# Patient Record
Sex: Male | Born: 1970 | Race: Black or African American | Hispanic: No | Marital: Married | Smoking: Never smoker
Health system: Southern US, Community
[De-identification: ages and names within clinical notes are randomized; demographics above are authoritative.]

## PROBLEM LIST (undated history)

## (undated) DIAGNOSIS — E119 Type 2 diabetes mellitus without complications: Secondary | ICD-10-CM

## (undated) HISTORY — PX: CIRCUMCISION: SUR203

## (undated) HISTORY — PX: KNEE SURGERY: SHX244

---

## 1998-05-09 ENCOUNTER — Emergency Department (HOSPITAL_COMMUNITY): Admission: EM | Admit: 1998-05-09 | Discharge: 1998-05-09 | Payer: Self-pay | Admitting: Emergency Medicine

## 1999-04-01 ENCOUNTER — Emergency Department (HOSPITAL_COMMUNITY): Admission: EM | Admit: 1999-04-01 | Discharge: 1999-04-01 | Payer: Self-pay | Admitting: Emergency Medicine

## 1999-04-01 ENCOUNTER — Encounter: Payer: Self-pay | Admitting: Emergency Medicine

## 2003-01-22 ENCOUNTER — Encounter: Admission: RE | Admit: 2003-01-22 | Discharge: 2003-04-22 | Payer: Self-pay | Admitting: *Deleted

## 2007-07-19 ENCOUNTER — Encounter (INDEPENDENT_AMBULATORY_CARE_PROVIDER_SITE_OTHER): Payer: Self-pay | Admitting: Urology

## 2007-07-19 ENCOUNTER — Ambulatory Visit (HOSPITAL_COMMUNITY): Admission: RE | Admit: 2007-07-19 | Discharge: 2007-07-19 | Payer: Self-pay | Admitting: Urology

## 2007-11-21 ENCOUNTER — Emergency Department (HOSPITAL_BASED_OUTPATIENT_CLINIC_OR_DEPARTMENT_OTHER): Admission: EM | Admit: 2007-11-21 | Discharge: 2007-11-21 | Payer: Self-pay | Admitting: Emergency Medicine

## 2008-08-15 ENCOUNTER — Ambulatory Visit (HOSPITAL_COMMUNITY): Admission: RE | Admit: 2008-08-15 | Discharge: 2008-08-15 | Payer: Self-pay | Admitting: Family Medicine

## 2010-07-22 NOTE — H&P (Signed)
NAME:  Tyler Simmons, Tyler Simmons             ACCOUNT NO.:  [id number]   MEDICAL RECORD NO.:  [id number]          PATIENT TYPE:  AMB   LOCATION:  DAY                           FACILITY:  [facility]   PHYSICIAN:  Ky Barban, M.D.DATE OF BIRTH:  04-01-1970   DATE OF ADMISSION:  DATE OF DISCHARGE:  LH                              HISTORY & PHYSICAL   This patient is coming as outpatient tomorrow to have surgery done in  [facility].   CHIEF COMPLAINT:  __________   HISTORY:  40 year old gentleman who has having recurrent infection  and papules, so I have advised him to undergo circumcision.  I have not  given him any guarantees about the results, but there is a good chance  he can get rid of these infections.   ALLERGIES:  NEGATIVE.   FAMILY HISTORY:  Negative.   REVIEW OF SYSTEMS:  Unremarkable.   EXAMINATION:  Blood pressure 127/74, temperature 97.6.  CENTRAL NERVOUS SYSTEM:  __________   HEAD/NECK:  Negative.  CHEST:  __________  HEART:  Regular sinus rhythm.  No murmur.  ABDOMEN:  Soft.  Liver, spleen , kidneys are not palpable.  No CVA  tenderness.  EXTERNAL GENITALIA:  Testicles are normal.  RECTAL:  Exam is deferred.   IMPRESSION:  Recurrent __________   PLAN:  Circumcision under anesthesia as outpatient.      Ky Barban, M.D.  Electronically Signed     MIJ/MEDQ  D:  07/18/2007  T:  07/18/2007  Job:  [id number]

## 2010-07-22 NOTE — Op Note (Signed)
NAME:  Tyler Simmons, Tyler Simmons             ACCOUNT NO.:  [id number]   MEDICAL RECORD NO.:  [id number]          PATIENT TYPE:  AMB   LOCATION:  DAY                           FACILITY:  [facility]   PHYSICIAN:  Ky Barban, M.D.DATE OF BIRTH:  [birth year]   DATE OF PROCEDURE:  DATE OF DISCHARGE:                               OPERATIVE REPORT   PREOPERATIVE DIAGNOSIS:  Recurrent prostatitis.   POSTOPERATIVE DIAGNOSIS:  Recurrent prostatitis.   PROCEDURE:  Circumcision.   ANESTHESIA:  General.   PROCEDURE:  The patient is given general endotracheal anesthesia, placed  in supine position.  After usual prep and drape, a dorsal slit is made  and then redundant prepuce is circumferentially excised leaving about 3-  4 mm of mucosa.  Some of the bleeders were ligated, others were simply  fulgurated.  After achieving complete hemostasis, the skin and mucosa  circumferentially closed with interrupted sutures of 4-0 chromic.  At  the end, the wound is wrapped in Vaseline gauze and then 2-inch Kling.  No complications.  The patient left the operating room in satisfactory  condition.      Ky Barban, M.D.  Electronically Signed     MIJ/MEDQ  D:  07/19/2007  T:  07/20/2007  Job:  [id number]

## 2010-12-10 LAB — POCT CARDIAC MARKERS
CKMB, poc: 3.1
Myoglobin, poc: 65.1

## 2010-12-10 LAB — BASIC METABOLIC PANEL
BUN: 13
Calcium: 9.2
Creatinine, Ser: 1
GFR calc Af Amer: >60
GFR calc non Af Amer: >60

## 2013-12-14 ENCOUNTER — Ambulatory Visit (HOSPITAL_COMMUNITY)
Admission: RE | Admit: 2013-12-14 | Discharge: 2013-12-14 | Disposition: A | Discharge status: Home / Self Care | Payer: BC Managed Care – PPO | Source: Ambulatory Visit | Attending: Family Medicine | Admitting: Family Medicine

## 2013-12-14 ENCOUNTER — Other Ambulatory Visit (HOSPITAL_COMMUNITY): Payer: Self-pay | Admitting: Family Medicine

## 2013-12-14 DIAGNOSIS — N508 Other specified disorders of male genital organs: Secondary | ICD-10-CM | POA: Diagnosis present

## 2013-12-14 DIAGNOSIS — I861 Scrotal varices: Secondary | ICD-10-CM | POA: Insufficient documentation

## 2013-12-14 DIAGNOSIS — N492 Inflammatory disorders of scrotum: Secondary | ICD-10-CM

## 2013-12-14 DIAGNOSIS — N5089 Other specified disorders of the male genital organs: Secondary | ICD-10-CM

## 2014-10-20 ENCOUNTER — Encounter (HOSPITAL_COMMUNITY): Payer: Self-pay | Admitting: *Deleted

## 2014-10-20 ENCOUNTER — Inpatient Hospital Stay (HOSPITAL_COMMUNITY)
Admission: EM | Admit: 2014-10-20 | Discharge: 2014-10-21 | DRG: 558 | Disposition: A | Discharge status: Home / Self Care | Payer: BLUE CROSS/BLUE SHIELD | Attending: Internal Medicine | Admitting: Internal Medicine

## 2014-10-20 DIAGNOSIS — N179 Acute kidney failure, unspecified: Secondary | ICD-10-CM

## 2014-10-20 DIAGNOSIS — Z833 Family history of diabetes mellitus: Secondary | ICD-10-CM

## 2014-10-20 DIAGNOSIS — T796XXD Traumatic ischemia of muscle, subsequent encounter: Secondary | ICD-10-CM

## 2014-10-20 DIAGNOSIS — M6282 Rhabdomyolysis: Secondary | ICD-10-CM | POA: Diagnosis present

## 2014-10-20 DIAGNOSIS — E119 Type 2 diabetes mellitus without complications: Secondary | ICD-10-CM | POA: Diagnosis present

## 2014-10-20 DIAGNOSIS — E860 Dehydration: Secondary | ICD-10-CM

## 2014-10-20 HISTORY — DX: Type 2 diabetes mellitus without complications: E11.9

## 2014-10-20 LAB — GLUCOSE, CAPILLARY: GLUCOSE-CAPILLARY: 141 mg/dL — AB (ref 65–99)

## 2014-10-20 LAB — CBG MONITORING, ED: Glucose-Capillary: 141 mg/dL — ABNORMAL HIGH (ref 65–99)

## 2014-10-20 LAB — CBC WITH DIFFERENTIAL/PLATELET
BASOS PCT: 0 % (ref 0–1)
Basophils Absolute: 0 10*3/uL (ref 0.0–0.1)
EOS ABS: 0 10*3/uL (ref 0.0–0.7)
Eosinophils Relative: 0 % (ref 0–5)
HCT: 48.5 % (ref 39.0–52.0)
Hemoglobin: 16.3 g/dL (ref 13.0–17.0)
Lymphocytes Relative: 29 % (ref 12–46)
Lymphs Abs: 3 10*3/uL (ref 0.7–4.0)
MCH: 28.4 pg (ref 26.0–34.0)
MCHC: 33.6 g/dL (ref 30.0–36.0)
MCV: 84.5 fL (ref 78.0–100.0)
MONO ABS: 1 10*3/uL (ref 0.1–1.0)
Monocytes Relative: 9 % (ref 3–12)
Neutro Abs: 6.5 10*3/uL (ref 1.7–7.7)
Neutrophils Relative %: 62 % (ref 43–77)
Platelets: 219 10*3/uL (ref 150–400)
RBC: 5.74 MIL/uL (ref 4.22–5.81)
RDW: 14.3 % (ref 11.5–15.5)
WBC: 10.5 10*3/uL (ref 4.0–10.5)

## 2014-10-20 LAB — BASIC METABOLIC PANEL
ANION GAP: 16 — AB (ref 5–15)
BUN: 22 mg/dL — AB (ref 6–20)
CALCIUM: 11.3 mg/dL — AB (ref 8.9–10.3)
CO2: 23 mmol/L (ref 22–32)
Chloride: 101 mmol/L (ref 101–111)
Creatinine, Ser: 2.77 mg/dL — ABNORMAL HIGH (ref 0.61–1.24)
GFR calc Af Amer: 30 mL/min — ABNORMAL LOW (ref >60)
GFR, EST NON AFRICAN AMERICAN: 26 mL/min — AB (ref >60)
GLUCOSE: 140 mg/dL — AB (ref 65–99)
POTASSIUM: 4.1 mmol/L (ref 3.5–5.1)
Sodium: 140 mmol/L (ref 135–145)

## 2014-10-20 LAB — TROPONIN I

## 2014-10-20 LAB — CK: Total CK: 1074 U/L — ABNORMAL HIGH (ref 49–397)

## 2014-10-20 MED ORDER — ONDANSETRON HCL 4 MG PO TABS: 4.0000 mg | ORAL_TABLET | Freq: Four times a day (QID) | ORAL | Status: DC | PRN

## 2014-10-20 MED ORDER — DIAZEPAM 5 MG/ML IJ SOLN
2.5000 mg | Freq: Once | INTRAMUSCULAR | Status: AC
  Administered 2014-10-20: 2.5 mg via INTRAVENOUS
  Filled 2014-10-20: qty 2

## 2014-10-20 MED ORDER — MORPHINE SULFATE 2 MG/ML IJ SOLN
2.0000 mg | INTRAMUSCULAR | Status: DC | PRN
Start: 2014-10-20 — End: 2014-10-21

## 2014-10-20 MED ORDER — SODIUM CHLORIDE 0.9 % IJ SOLN
3.0000 mL | Freq: Two times a day (BID) | INTRAMUSCULAR | Status: DC
  Administered 2014-10-20: 3 mL via INTRAVENOUS

## 2014-10-20 MED ORDER — INSULIN ASPART 100 UNIT/ML ~~LOC~~ SOLN: 0.0000 [IU] | Freq: Three times a day (TID) | SUBCUTANEOUS | Status: DC

## 2014-10-20 MED ORDER — SODIUM CHLORIDE 0.9 % IV BOLUS (SEPSIS)
30.0000 mL/kg | Freq: Once | INTRAVENOUS | Status: AC
  Administered 2014-10-20: 3129 mL via INTRAVENOUS

## 2014-10-20 MED ORDER — ACETAMINOPHEN 325 MG PO TABS: 650.0000 mg | ORAL_TABLET | Freq: Four times a day (QID) | ORAL | Status: DC | PRN

## 2014-10-20 MED ORDER — INSULIN ASPART 100 UNIT/ML ~~LOC~~ SOLN: 0.0000 [IU] | Freq: Every day | SUBCUTANEOUS | Status: DC

## 2014-10-20 MED ORDER — SODIUM CHLORIDE 0.9 % IV SOLN
INTRAVENOUS | Status: DC
  Administered 2014-10-20: 21:00:00 via INTRAVENOUS

## 2014-10-20 MED ORDER — FENTANYL CITRATE (PF) 100 MCG/2ML IJ SOLN
50.0000 ug | Freq: Once | INTRAMUSCULAR | Status: AC
  Administered 2014-10-20: 50 ug via INTRAVENOUS
  Filled 2014-10-20: qty 2

## 2014-10-20 MED ORDER — ONDANSETRON HCL 4 MG/2ML IJ SOLN: 4.0000 mg | Freq: Four times a day (QID) | INTRAMUSCULAR | Status: DC | PRN

## 2014-10-20 MED ORDER — ASPIRIN 325 MG PO TABS
325.0000 mg | ORAL_TABLET | Freq: Once | ORAL | Status: AC
  Administered 2014-10-20: 325 mg via ORAL
  Filled 2014-10-20: qty 1

## 2014-10-20 MED ORDER — HEPARIN SODIUM (PORCINE) 5000 UNIT/ML IJ SOLN: 5000.0000 [IU] | Freq: Three times a day (TID) | INTRAMUSCULAR | Status: DC

## 2014-10-20 MED ORDER — ACETAMINOPHEN 650 MG RE SUPP: 650.0000 mg | Freq: Four times a day (QID) | RECTAL | Status: DC | PRN

## 2014-10-20 NOTE — ED Notes (Signed)
Report attempted. RN to call back.

## 2014-10-20 NOTE — ED Notes (Signed)
While assessing the patient, Tyler Simmons had a LOC for approximately 20 seconds. When patient awoke patient was unaware of his surroundings and had no idea how he got here. After orienting patient he returned to pre LOC state.

## 2014-10-20 NOTE — ED Provider Notes (Signed)
CSN: [id number]     Arrival date & time 10/20/14  1728 History   First MD Initiated Contact with Patient 10/20/14 1803     Chief Complaint  Patient presents with  . Spasms     (Consider location/radiation/quality/duration/timing/severity/associated sxs/prior Treatment) Patient is a 44 y.o. male presenting with general illness.  Illness Location:  Whole body Quality:  Cramping, sleepiness Severity:  Mild Onset quality:  Gradual Duration:  2 hours Timing:  Constant Chronicity:  New Context:  After prolonged heat exposure one day prior, today without adequate fluid intake Relieved by:  Improved some with gatorade Worsened by:  Movement Ineffective treatments:  None  Associated symptoms: fatigue, myalgias and nausea   Associated symptoms: no abdominal pain, no chest pain, no cough, no fever, no loss of consciousness and no vomiting     Past Medical History  Diagnosis Date  . Diabetes mellitus without complication    History reviewed. No pertinent past surgical history. History reviewed. No pertinent family history. Social History  Substance Use Topics  . Smoking status: Never Smoker   . Smokeless tobacco: None  . Alcohol Use: Yes     Comment: Occ    Review of Systems  Constitutional: Positive for fatigue. Negative for fever.  Respiratory: Negative for cough.   Cardiovascular: Negative for chest pain.  Gastrointestinal: Positive for nausea. Negative for vomiting and abdominal pain.  Endocrine: Negative for polydipsia and polyuria.  Musculoskeletal: Positive for myalgias.  Neurological: Positive for syncope (here per nurse report). Negative for loss of consciousness.  Psychiatric/Behavioral: Positive for confusion (intermittent).  All other systems reviewed and are negative.     Allergies  Review of patient's allergies indicates no known allergies.  Home Medications   Prior to Admission medications   Medication Sig Start Date End Date Taking? Authorizing  Provider  JANUVIA 100 MG tablet Take 100 mg by mouth daily. 08/27/14  Yes Historical Provider, MD  pioglitazone-metformin (ACTOPLUS MET) 15-850 MG per tablet Take 1 tablet by mouth 2 (two) times daily. 09/15/14  Yes Historical Provider, MD  VICTOZA 18 MG/3ML SOPN Inject 1.8 mLs into the skin daily. 09/15/14  Yes Historical Provider, MD  Vitamin D, Ergocalciferol, (DRISDOL) 50000 UNITS CAPS capsule Take 50,000 Units by mouth once a week. Takes on Sunday 09/15/14   Historical Provider, MD   BP 128/60 mmHg  Pulse 60  Temp(Src) 97.6 F (36.4 C) (Oral)  Resp 16  Ht _0  (1.803 m)  Wt 225 lb 1.6 oz (102.105 kg)  BMI 31.41 kg/m2  SpO2 100% Physical Exam  Constitutional: He is oriented to person, place, and time. He appears well-developed and well-nourished.  HENT:  Head: Normocephalic and atraumatic.  Eyes: Conjunctivae and EOM are normal.  Neck: Normal range of motion. Neck supple.  Cardiovascular: Regular rhythm.  Tachycardia present.   Pulmonary/Chest: Effort normal. No respiratory distress.  Abdominal: Soft. There is no tenderness.  Musculoskeletal: Normal range of motion. He exhibits no edema or tenderness.  Neurological: He is alert and oriented to person, place, and time.  Skin: Skin is warm and dry.  Tenting, delayed cap refill  Nursing note and vitals reviewed.   ED Course  Procedures (including critical care time) Labs Review Labs Reviewed  BASIC METABOLIC PANEL - Abnormal; Notable for the following:    Glucose, Bld 140 (*)    BUN 22 (*)    Creatinine, Ser 2.77 (*)    Calcium 11.3 (*)    GFR calc non Af Amer 26 (*)  GFR calc Af Amer 30 (*)    Anion gap 16 (*)    All other components within normal limits  CK - Abnormal; Notable for the following:    Total CK 1074 (*)    All other components within normal limits  COMPREHENSIVE METABOLIC PANEL - Abnormal; Notable for the following:    Calcium 8.4 (*)    Total Protein 6.3 (*)    All other components within normal limits   CBC - Abnormal; Notable for the following:    Hemoglobin 12.8 (*)    All other components within normal limits  CK - Abnormal; Notable for the following:    Total CK 1449 (*)    All other components within normal limits  GLUCOSE, CAPILLARY - Abnormal; Notable for the following:    Glucose-Capillary 141 (*)    All other components within normal limits  GLUCOSE, CAPILLARY - Abnormal; Notable for the following:    Glucose-Capillary 106 (*)    All other components within normal limits  CBG MONITORING, ED - Abnormal; Notable for the following:    Glucose-Capillary 141 (*)    All other components within normal limits  CBC WITH DIFFERENTIAL/PLATELET  TROPONIN I    Imaging Review No results found. I, Mckenzye Cutright, Corene Cornea, personally reviewed and evaluated these images and lab results as part of my medical decision-making.   EKG Interpretation None      MDM   Final diagnoses:  Non-traumatic rhabdomyolysis  Acute renal failure, unspecified acute renal failure type  Dehydration   44 year old male with a history of diabetes presents to the emergency department today for initial concern of dehydration versus he syncope. However found to have rhabdomyolysis while here. There is likely secondary to heat exposure working on the heat. 3 L of fluids given and patient started having urine output. However with significant acute renal failure and a CK low thousand I admitted to hospitalist for further management. Did not feel like he was going to need dialysis so was admitted here.  Spoke with cardiology about st elevations seen on ecg. Without chest pain, sob or other cardiac equivalents, diffuse elevations on ECG, no reciprocal changes and negative troponin, Dr. Rosanna Randy thought it unlikely to be ACS requiring intervention and more likely to be repolarization and felt that he could be admitted here for management of rhabdomyolysis.    The patient appears reasonably stabilized for admission considering  the current resources, flow, and capabilities available in the ED at this time, and I doubt any other [facility] requiring further screening and/or treatment in the ED prior to admission.     Merrily Pew, MD 10/21/14 1037

## 2014-10-20 NOTE — ED Notes (Signed)
Pt has been outside all day mowing from 10a-4p.. Pt states cramping all over body began 1hour ago. Patient is in severe pain and is diaphoretic.

## 2014-10-20 NOTE — ED Notes (Signed)
Patient states pain radiates from his tailbone down to his foot. Hurts with movement.

## 2014-10-20 NOTE — H&P (Addendum)
[facility] History and Physical  Tyler Simmons CXK:[id number] DOB: 1970-08-12 DOA: 10/20/2014  Referring physician: Emergency Department PCP: Purvis Kilts, MD  Specialists:   Chief Complaint: ARF  HPI: Tyler Simmons is a 44 y.o. male  With a hx of DM presents with diffuse cramping after working outdoors in the heat for multiple hours today. In the ED, pt was noted to have Cr of 2.77 with BUN of 22 and CK of 1074. Pt was started on IVF in the ED. During evaluation, pt noted to have transient episode of LOC, given one dose of valium.  Given ARF with concerns of rhabdo, hospitalist was consulted for consideration for admission  Review of Systems:   Review of Systems  Constitutional: Positive for malaise/fatigue. Negative for chills.  HENT: Negative for tinnitus.   Eyes: Negative for pain and discharge.  Respiratory: Negative for hemoptysis and shortness of breath.   Cardiovascular: Negative for orthopnea and PND.  Gastrointestinal: Negative for abdominal pain and melena.  Genitourinary: Negative for frequency and hematuria.  Musculoskeletal: Positive for myalgias, back pain and joint pain.  Skin: Negative for itching.  Neurological: Positive for weakness. Negative for tingling, tremors and headaches.  Psychiatric/Behavioral: Negative for hallucinations and memory loss.     Past Medical History  Diagnosis Date  . Diabetes mellitus without complication    History reviewed. No pertinent past surgical history. Social History:  reports that he has never smoked. He does not have any smokeless tobacco history on file. He reports that he drinks alcohol. His drug history is not on file.  where does patient live--home, ALF, SNF? and with whom if at home?  Can patient participate in ADLs?  No Known Allergies  No family history on file. Diabetes "everyone in the family" (be sure to complete)  Prior to Admission medications   Medication Sig Start Date End Date  Taking? Authorizing Provider  JANUVIA 100 MG tablet Take 100 mg by mouth daily. 08/27/14  Yes Historical Provider, MD  pioglitazone-metformin (ACTOPLUS MET) 15-850 MG per tablet Take 1 tablet by mouth 2 (two) times daily. 09/15/14  Yes Historical Provider, MD  VICTOZA 18 MG/3ML SOPN Inject 1.8 mLs into the skin daily. 09/15/14  Yes Historical Provider, MD  Vitamin D, Ergocalciferol, (DRISDOL) 50000 UNITS CAPS capsule Take 50,000 Units by mouth once a week. Takes on Sunday 09/15/14   Historical Provider, MD   Physical Exam: Filed Vitals:   10/20/14 1735 10/20/14 1830 10/20/14 2010  BP:  129/92 134/82  Pulse: 80 93 85  Temp: 97.8 F (36.6 C)    TempSrc: Oral    Resp: 36 17 18  Height: 5' 10" (1.778 m)    Weight: 104.327 kg (230 lb)    SpO2: 98% 90% 100%     General:  Awake, in nad  Eyes: PERRL B  ENT: membranes dry, dentition fair  Neck: trachea midline, neck supple  Cardiovascular: regular, s1, s2  Respiratory: normal resp effort, no wheezing  Abdomen: soft,nondistended  Skin: decreased skin turgor, no abnormal skin lesions seen  Musculoskeletal: perfused, no clubbing  Psychiatric: mood/affect normal //no auditory/visual hallucinations  Neurologic: cn2-12 grossly intact, strength/sensation intact  Labs on Admission:  Basic Metabolic Panel:  Recent Labs Lab 10/20/14 1812  NA 140  K 4.1  CL 101  CO2 23  GLUCOSE 140*  BUN 22*  CREATININE 2.77*  CALCIUM 11.3*   Liver Function Tests: No results for input(s): AST, ALT, ALKPHOS, BILITOT, PROT, ALBUMIN in the last 168 hours.  No results for input(s): LIPASE, AMYLASE in the last 168 hours. No results for input(s): AMMONIA in the last 168 hours. CBC:  Recent Labs Lab 10/20/14 1812  WBC 10.5  NEUTROABS 6.5  HGB 16.3  HCT 48.5  MCV 84.5  PLT 219   Cardiac Enzymes:  Recent Labs Lab 10/20/14 1812  CKTOTAL 1074*  TROPONINI <0.03    BNP (last 3 results) No results for input(s): BNP in the last 8760  hours.  ProBNP (last 3 results) No results for input(s): PROBNP in the last 8760 hours.  CBG:  Recent Labs Lab 10/20/14 1810  GLUCAP 141*    Radiological Exams on Admission: No results found.  EKG: Independently reviewed. Diffuse ST elevation  Assessment/Plan Principal Problem:   Rhabdomyolysis Active Problems:   ARF (acute renal failure)   Dehydration   1. Rhabdomyolysis 1. Likely secondary to working outdoors in heat with little hydration 2. Cont aggressive IVF as tolerated 3. Avoid nephrotoxic agents 4. Admit to med-tele, inpt 2. ARF 1. Secondary to above 2. Cont aggressive IVF 3. Follow renal function closely 3. Dehydration 1. Cont with IVF hydration 4. DM 1. Cont SSI coverage 5. DVT prophylaxis 1. Heparin subq 6. Transient LOC 1. Suspect secondary to marked dehydration 2. Monitor closely 7. ST changes on EKG 1. Pt denies chest pain, sob, nausea. Initial trop neg 2. EDP had discussed EKG with Cardiology - see EDP note 3. Essentially, EKG changes likely not secondary to STEMI 4. Will repeat EKG in AM  Code Status: Full Family Communication: Pt in room, family at bedside Disposition Plan: admit med-tele   CHIU, STEPHEN K [facility] Pager [phone]  If 7PM-7AM, please contact night-coverage [email or web address] Password [email or web address] 10/20/2014, 8:13 PM  

## 2014-10-21 DIAGNOSIS — T796XXD Traumatic ischemia of muscle, subsequent encounter: Secondary | ICD-10-CM

## 2014-10-21 DIAGNOSIS — E860 Dehydration: Secondary | ICD-10-CM

## 2014-10-21 DIAGNOSIS — N179 Acute kidney failure, unspecified: Secondary | ICD-10-CM

## 2014-10-21 LAB — COMPREHENSIVE METABOLIC PANEL
ALT: 26 U/L (ref 17–63)
AST: 33 U/L (ref 15–41)
Albumin: 3.5 g/dL (ref 3.5–5.0)
Alkaline Phosphatase: 59 U/L (ref 38–126)
Anion gap: 6 (ref 5–15)
BUN: 14 mg/dL (ref 6–20)
CALCIUM: 8.4 mg/dL — AB (ref 8.9–10.3)
CO2: 24 mmol/L (ref 22–32)
CREATININE: 1.01 mg/dL (ref 0.61–1.24)
Chloride: 111 mmol/L (ref 101–111)
GFR calc Af Amer: >60 mL/min (ref >60)
GLUCOSE: 99 mg/dL (ref 65–99)
POTASSIUM: 3.8 mmol/L (ref 3.5–5.1)
SODIUM: 141 mmol/L (ref 135–145)
Total Bilirubin: 0.7 mg/dL (ref 0.3–1.2)
Total Protein: 6.3 g/dL — ABNORMAL LOW (ref 6.5–8.1)

## 2014-10-21 LAB — CBC
HEMATOCRIT: 39.3 % (ref 39.0–52.0)
HEMOGLOBIN: 12.8 g/dL — AB (ref 13.0–17.0)
MCH: 27.7 pg (ref 26.0–34.0)
MCHC: 32.6 g/dL (ref 30.0–36.0)
MCV: 85.1 fL (ref 78.0–100.0)
PLATELETS: 183 10*3/uL (ref 150–400)
RBC: 4.62 MIL/uL (ref 4.22–5.81)
RDW: 14.5 % (ref 11.5–15.5)
WBC: 9.6 10*3/uL (ref 4.0–10.5)

## 2014-10-21 LAB — CK: Total CK: 1449 U/L — ABNORMAL HIGH (ref 49–397)

## 2014-10-21 LAB — GLUCOSE, CAPILLARY: GLUCOSE-CAPILLARY: 106 mg/dL — AB (ref 65–99)

## 2014-10-21 NOTE — Discharge Summary (Signed)
Physician Discharge Summary  CHIMAOBI CASEBOLT DSK:[id number] DOB: 20-Mar-1970 DOA: 10/20/2014  PCP: Purvis Kilts, MD  Admit date: 10/20/2014 Discharge date: 10/21/2014  Time spent: 45 minutes  Recommendations for Outpatient Follow-up:  -Will be discharged home today. -Advised to follow-up with primary care provider in 2 weeks. -Counseled on importance of taking copious amounts of oral fluids.   Discharge Diagnoses:  Principal Problem:   Rhabdomyolysis Active Problems:   ARF (acute renal failure)   Dehydration   Discharge Condition: Stable and improved  Filed Weights   10/20/14 1735 10/20/14 2300  Weight: 104.327 kg (230 lb) 102.105 kg (225 lb 1.6 oz)    History of present illness:  Tyler Simmons is a 44 y.o. male  With a hx of DM presents with diffuse cramping after working outdoors in the heat for multiple hours today. In the ED, pt was noted to have Cr of 2.77 with BUN of 22 and CK of 1074. Pt was started on IVF in the ED. During evaluation, pt noted to have transient episode of LOC, given one dose of valium.  Given ARF with concerns of rhabdo, hospitalist was consulted for consideration for admission  Hospital Course:   Rhabdomyolysis -Secondary to working outdoors in the heat with little hydration. -Muscle cramps have resolved, patient desires to discharge home today. CPK still elevated at around 1000. -Counseled on importance of taking copious amounts of oral fluids.  Acute renal failure -Secondary to rhabdomyolysis. -Resolved with IV hydration.  Transient loss of consciousness. -Secondary to dehydration, further workup.  Diffuse ST changes on EKG -Discussion was had between EDP and cardiologist at [facility], decision was made that this appeared to be more repolarization changes and with lack of chest pain, no further cardiac workup was recommended.  Procedures:  None   Consultations:  None  Discharge Instructions  Discharge  Instructions    Diet Carb Modified    Complete by:  As directed      Increase activity slowly    Complete by:  As directed             Medication List    TAKE these medications        JANUVIA 100 MG tablet  Generic drug:  sitaGLIPtin  Take 100 mg by mouth daily.     pioglitazone-metformin 15-850 MG per tablet  Commonly known as:  ACTOPLUS MET  Take 1 tablet by mouth 2 (two) times daily.     VICTOZA 18 MG/3ML Sopn  Generic drug:  Liraglutide  Inject 1.8 mLs into the skin daily.     Vitamin D (Ergocalciferol) 50000 UNITS Caps capsule  Commonly known as:  DRISDOL  Take 50,000 Units by mouth once a week. Takes on Sunday       No Known Allergies     Follow-up Information    Follow up with Purvis Kilts, MD. Schedule an appointment as soon as possible for a visit in 2 weeks.   Specialty:  Family Medicine   Contact information:   [address] [place] [address] [phone]        The results of significant diagnostics from this hospitalization (including imaging, microbiology, ancillary and laboratory) are listed below for reference.    Significant Diagnostic Studies: No results found.  Microbiology: No results found for this or any previous visit (from the past 240 hour(s)).   Labs: Basic Metabolic Panel:  Recent Labs Lab 10/20/14 1812 10/21/14 0602  NA 140 141  K 4.1  3.8  CL 101 111  CO2 23 24  GLUCOSE 140* 99  BUN 22* 14  CREATININE 2.77* 1.01  CALCIUM 11.3* 8.4*   Liver Function Tests:  Recent Labs Lab 10/21/14 0602  AST 33  ALT 26  ALKPHOS 59  BILITOT 0.7  PROT 6.3*  ALBUMIN 3.5   No results for input(s): LIPASE, AMYLASE in the last 168 hours. No results for input(s): AMMONIA in the last 168 hours. CBC:  Recent Labs Lab 10/20/14 1812 10/21/14 0602  WBC 10.5 9.6  NEUTROABS 6.5  --   HGB 16.3 12.8*  HCT 48.5 39.3  MCV 84.5 85.1  PLT 219 183   Cardiac Enzymes:  Recent Labs Lab 10/20/14 1812 10/21/14 0602   CKTOTAL 6283* 1449*  TROPONINI <0.03  --    BNP: BNP (last 3 results) No results for input(s): BNP in the last 8760 hours.  ProBNP (last 3 results) No results for input(s): PROBNP in the last 8760 hours.  CBG:  Recent Labs Lab 10/20/14 1810 10/20/14 2315 10/21/14 0727  GLUCAP 141* 141* 106*       Signed:  HERNANDEZ ACOSTA,ESTELA  [facility] Pager: [phone] 10/21/2014, 11:23 AM

## 2014-10-21 NOTE — Progress Notes (Signed)
Pt and family educated on condition and treatment.  Pt advised to stay out of the heat for the next 10 days and to rehydrate when he gets home.  Pt and family members in the room all verbalize understanding.  IV d/c and site is WNL.  Pt in stable condition at discharge and discharged to the care of his family.

## 2014-10-21 NOTE — Progress Notes (Signed)
Utilization review Completed Mcarthur Ivins RN BSN   

## 2018-01-05 ENCOUNTER — Emergency Department (HOSPITAL_COMMUNITY): Payer: BLUE CROSS/BLUE SHIELD

## 2018-01-05 ENCOUNTER — Encounter (HOSPITAL_COMMUNITY): Payer: Self-pay

## 2018-01-05 ENCOUNTER — Other Ambulatory Visit: Payer: Self-pay

## 2018-01-05 ENCOUNTER — Emergency Department (HOSPITAL_COMMUNITY)
Admission: EM | Admit: 2018-01-05 | Discharge: 2018-01-06 | Disposition: A | Discharge status: Home / Self Care | Payer: BLUE CROSS/BLUE SHIELD | Attending: Emergency Medicine | Admitting: Emergency Medicine

## 2018-01-05 DIAGNOSIS — Y9389 Activity, other specified: Secondary | ICD-10-CM | POA: Diagnosis not present

## 2018-01-05 DIAGNOSIS — M542 Cervicalgia: Secondary | ICD-10-CM | POA: Diagnosis present

## 2018-01-05 DIAGNOSIS — M545 Low back pain: Secondary | ICD-10-CM | POA: Diagnosis not present

## 2018-01-05 DIAGNOSIS — Y999 Unspecified external cause status: Secondary | ICD-10-CM | POA: Insufficient documentation

## 2018-01-05 DIAGNOSIS — E119 Type 2 diabetes mellitus without complications: Secondary | ICD-10-CM | POA: Diagnosis not present

## 2018-01-05 DIAGNOSIS — R0789 Other chest pain: Secondary | ICD-10-CM | POA: Diagnosis not present

## 2018-01-05 DIAGNOSIS — Y92410 Unspecified street and highway as the place of occurrence of the external cause: Secondary | ICD-10-CM | POA: Insufficient documentation

## 2018-01-05 DIAGNOSIS — Z7984 Long term (current) use of oral hypoglycemic drugs: Secondary | ICD-10-CM | POA: Insufficient documentation

## 2018-01-05 MED ORDER — HYDROMORPHONE HCL 1 MG/ML IJ SOLN
1.0000 mg | Freq: Once | INTRAMUSCULAR | Status: AC
  Administered 2018-01-05: 1 mg via INTRAMUSCULAR
  Filled 2018-01-05: qty 1
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
## 2018-01-05 NOTE — ED Notes (Signed)
Patient transported to CT 

## 2018-01-05 NOTE — ED Notes (Signed)
Patient transported to X-ray 
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
## 2018-01-06 LAB — CBG MONITORING, ED: Glucose-Capillary: 99 mg/dL (ref 70–99)

## 2018-01-06 MED ORDER — CYCLOBENZAPRINE HCL 10 MG PO TABS: 10.0000 mg | ORAL_TABLET | Freq: Two times a day (BID) | ORAL | 0 refills | Status: AC | PRN

## 2018-01-06 MED ORDER — IBUPROFEN 800 MG PO TABS: 800.0000 mg | ORAL_TABLET | Freq: Three times a day (TID) | ORAL | 0 refills | Status: DC

## 2019-12-30 ENCOUNTER — Other Ambulatory Visit: Payer: Self-pay

## 2019-12-30 ENCOUNTER — Ambulatory Visit
Admission: EM | Admit: 2019-12-30 | Discharge: 2019-12-30 | Disposition: A | Discharge status: Home / Self Care | Payer: BC Managed Care – PPO | Attending: Emergency Medicine | Admitting: Emergency Medicine

## 2019-12-30 ENCOUNTER — Encounter: Payer: Self-pay | Admitting: Emergency Medicine

## 2019-12-30 ENCOUNTER — Ambulatory Visit (INDEPENDENT_AMBULATORY_CARE_PROVIDER_SITE_OTHER): Payer: BC Managed Care – PPO

## 2019-12-30 DIAGNOSIS — X500XXA Overexertion from strenuous movement or load, initial encounter: Secondary | ICD-10-CM

## 2019-12-30 DIAGNOSIS — M25512 Pain in left shoulder: Secondary | ICD-10-CM | POA: Diagnosis not present

## 2019-12-30 MED ORDER — IBUPROFEN 800 MG PO TABS: 800.0000 mg | ORAL_TABLET | Freq: Three times a day (TID) | ORAL | 0 refills | Status: AC

## 2019-12-30 NOTE — Discharge Instructions (Addendum)
Take OTC Tylenol/ibuprofen as needed for pain Follow RICE instruction that is attached Follow-up with PCP Return or go to ED for worsening of symptoms 

## 2019-12-30 NOTE — ED Triage Notes (Signed)
LT shoulder pain with movement after moving cinder blocks that started on Thursday.

## 2019-12-30 NOTE — ED Provider Notes (Signed)
[place]   [id number] 12/30/19 Arrival Time: 3151  Chief Complaint  Patient presents with   Shoulder Pain    SUBJECTIVE: History from: patient.  Tyler Simmons is a 49 y.o. male who presented to the urgent care with a complaint of left shoulder pain for the past 3 days.  Report he lift cinderblocks.  He localizes the pain to the left shoulder.  He describes the pain as constant and achy.  He has tried OTC medications without relief.  His symptoms are made worse with ROM.  He denies similar symptoms in the past.  Denies chills, fever, nausea, vomiting, diarrhea.  ROS: As per HPI.  All other pertinent ROS negative.     Past Medical History:  Diagnosis Date   Diabetes mellitus without complication ([place])    Past Surgical History:  Procedure Laterality Date   CIRCUMCISION     KNEE SURGERY Left    No Known Allergies No current facility-administered medications on file prior to encounter.   Current Outpatient Medications on File Prior to Encounter  Medication Sig Dispense Refill   cyclobenzaprine (FLEXERIL) 10 MG tablet Take 1 tablet (10 mg total) by mouth 2 (two) times daily as needed for muscle spasms. 20 tablet 0   ibuprofen (ADVIL,MOTRIN) 800 MG tablet Take 1 tablet (800 mg total) by mouth 3 (three) times daily. 21 tablet 0   JANUVIA 100 MG tablet Take 100 mg by mouth daily.  1   pioglitazone-metformin (ACTOPLUS MET) 15-850 MG per tablet Take 1 tablet by mouth 2 (two) times daily.  1   VICTOZA 18 MG/3ML SOPN Inject 1.8 mLs into the skin daily.  1   Vitamin D, Ergocalciferol, (DRISDOL) 50000 UNITS CAPS capsule Take 50,000 Units by mouth once a week. Takes on Sunday  1   Social History   Socioeconomic History   Marital status: Married    Spouse name: Not on file   Number of children: Not on file   Years of education: Not on file   Highest education level: Not on file  Occupational History   Not on file  Tobacco Use   Smoking status: Never  Smoker   Smokeless tobacco: Never Used  Substance and Sexual Activity   Alcohol use: Yes    Comment: Occ   Drug use: Never   Sexual activity: Not on file  Other Topics Concern   Not on file  Social History Narrative   Not on file   Social Determinants of Health   Financial Resource Strain:    Difficulty of Paying Living Expenses: Not on file  Food Insecurity:    Worried About [place] in the Last Year: Not on file   Ran Out of Food in the Last Year: Not on file  Transportation Needs:    Lack of Transportation (Medical): Not on file   Lack of Transportation (Non-Medical): Not on file  Physical Activity:    Days of Exercise per Week: Not on file   Minutes of Exercise per Session: Not on file  Stress:    Feeling of Stress : Not on file  Social Connections:    Frequency of Communication with Friends and Family: Not on file   Frequency of Social Gatherings with Friends and Family: Not on file   Attends Religious Services: Not on file   Active Member of Clubs or Organizations: Not on file   Attends Archivist Meetings: Not on file   Marital Status: Not on file  Intimate Partner Violence:    Fear of Current or Ex-Partner: Not on file   Emotionally Abused: Not on file   Physically Abused: Not on file   Sexually Abused: Not on file   No family history on file.  OBJECTIVE:  Vitals:   12/30/19 1357 12/30/19 1359  BP:  137/88  Resp:  19  Temp:  99.4 F (37.4 C)  TempSrc:  Oral  SpO2:  95%  Weight: 225 lb (102.1 kg)   Height: '5\' 10"'  (1.778 m)      Physical Exam Vitals and nursing note reviewed.  Constitutional:      General: He is not in acute distress.    Appearance: Normal appearance. He is normal weight. He is not ill-appearing, toxic-appearing or diaphoretic.  Cardiovascular:     Rate and Rhythm: Normal rate and regular rhythm.     Pulses: Normal pulses.     Heart sounds: Normal heart sounds. No murmur heard.  No  friction rub. No gallop.   Pulmonary:     Effort: Pulmonary effort is normal. No respiratory distress.     Breath sounds: Normal breath sounds. No stridor. No wheezing, rhonchi or rales.  Chest:     Chest wall: No tenderness.  Musculoskeletal:        General: Tenderness present.     Right shoulder: Normal.     Left shoulder: Tenderness present.     Comments: The left shoulder is without any obvious asymmetry or deformity when compared to the right shoulder.  There is no ecchymosis, open wound, lesion, warmth, swelling present.  Unable to complete range of motion.  Neurovascular status intact.  Neurological:     Mental Status: He is alert and oriented to person, place, and time.      LABS:  No results found for this or any previous visit (from the past 24 hour(s)).   RADIOLOGY  DG Shoulder Left  Result Date: 12/30/2019 CLINICAL DATA:  Patient states that he was moving heavy cinder blocks around yesterday and has pulled something in his left shoulder, limited ROM and pain. No HX left shoulder pain/ concern for dislocation EXAM: LEFT SHOULDER - 2+ VIEW COMPARISON:  None. FINDINGS: Glenohumeral joint is intact. No evidence of scapular fracture or humeral fracture. The acromioclavicular joint is intact. IMPRESSION: No fracture or dislocation. Electronically Signed   By: Suzy Bouchard M.D.   On: 12/30/2019 14:35    Left shoulder x-ray is negative for bony abnormality including fracture or dislocation.  I have reviewed the x-ray myself and the radiologist interpretation.  I am in agreement with the radiologist interpretation.  ASSESSMENT & PLAN:  1. Acute pain of left shoulder     No orders of the defined types were placed in this encounter.   Discharge instructions  Take OTC Tylenol/ibuprofen as needed for pain Follow RICE instruction that is attached Follow-up with PCP Return or go to ED for worsening of symptoms  Reviewed expectations re: course of current medical issues.  Questions answered. Outlined signs and symptoms indicating need for more acute intervention. Patient verbalized understanding. After Visit Summary given.         Emerson Monte, FNP 12/30/19 1440

## 2020-01-27 ENCOUNTER — Encounter (HOSPITAL_COMMUNITY): Payer: Self-pay

## 2020-01-27 ENCOUNTER — Other Ambulatory Visit: Payer: Self-pay

## 2020-01-27 ENCOUNTER — Emergency Department (HOSPITAL_COMMUNITY)
Admission: EM | Admit: 2020-01-27 | Discharge: 2020-01-27 | Disposition: A | Discharge status: Home / Self Care | Payer: BC Managed Care – PPO | Attending: Emergency Medicine | Admitting: Emergency Medicine

## 2020-01-27 ENCOUNTER — Telehealth (HOSPITAL_COMMUNITY): Payer: Self-pay

## 2020-01-27 ENCOUNTER — Other Ambulatory Visit (HOSPITAL_COMMUNITY): Payer: Self-pay | Admitting: Family

## 2020-01-27 DIAGNOSIS — U071 COVID-19: Secondary | ICD-10-CM | POA: Insufficient documentation

## 2020-01-27 DIAGNOSIS — E119 Type 2 diabetes mellitus without complications: Secondary | ICD-10-CM | POA: Diagnosis not present

## 2020-01-27 DIAGNOSIS — R300 Dysuria: Secondary | ICD-10-CM | POA: Insufficient documentation

## 2020-01-27 DIAGNOSIS — M549 Dorsalgia, unspecified: Secondary | ICD-10-CM | POA: Diagnosis present

## 2020-01-27 LAB — CBC WITH DIFFERENTIAL/PLATELET
Abs Immature Granulocytes: 0.01 10*3/uL (ref 0.00–0.07)
Basophils Absolute: 0 10*3/uL (ref 0.0–0.1)
Basophils Relative: 0 %
Eosinophils Absolute: 0 10*3/uL (ref 0.0–0.5)
Eosinophils Relative: 0 %
HCT: 45.9 % (ref 39.0–52.0)
Hemoglobin: 14.3 g/dL (ref 13.0–17.0)
Immature Granulocytes: 0 %
Lymphocytes Relative: 24 %
Lymphs Abs: 1.1 10*3/uL (ref 0.7–4.0)
MCH: 26.2 pg (ref 26.0–34.0)
MCHC: 31.2 g/dL (ref 30.0–36.0)
MCV: 84.1 fL (ref 80.0–100.0)
Monocytes Absolute: 0.4 10*3/uL (ref 0.1–1.0)
Monocytes Relative: 9 %
Neutro Abs: 3 10*3/uL (ref 1.7–7.7)
Neutrophils Relative %: 67 %
Platelets: 114 10*3/uL — ABNORMAL LOW (ref 150–400)
RBC: 5.46 MIL/uL (ref 4.22–5.81)
RDW: 14.6 % (ref 11.5–15.5)
WBC: 4.5 10*3/uL (ref 4.0–10.5)
nRBC: 0 % (ref 0.0–0.2)

## 2020-01-27 LAB — URINALYSIS, ROUTINE W REFLEX MICROSCOPIC
Bacteria, UA: NONE SEEN
Bilirubin Urine: NEGATIVE
Glucose, UA: >=500 mg/dL — AB
Ketones, ur: 20 mg/dL — AB
Leukocytes,Ua: NEGATIVE
Nitrite: NEGATIVE
Protein, ur: 100 mg/dL — AB
Specific Gravity, Urine: 1.031 — ABNORMAL HIGH (ref 1.005–1.030)
pH: 5 (ref 5.0–8.0)

## 2020-01-27 LAB — COMPREHENSIVE METABOLIC PANEL
ALT: 42 U/L (ref 0–44)
AST: 50 U/L — ABNORMAL HIGH (ref 15–41)
Albumin: 3.7 g/dL (ref 3.5–5.0)
Alkaline Phosphatase: 57 U/L (ref 38–126)
Anion gap: 9 (ref 5–15)
BUN: 19 mg/dL (ref 6–20)
CO2: 25 mmol/L (ref 22–32)
Calcium: 8.5 mg/dL — ABNORMAL LOW (ref 8.9–10.3)
Chloride: 100 mmol/L (ref 98–111)
Creatinine, Ser: 1.11 mg/dL (ref 0.61–1.24)
GFR, Estimated: >60 mL/min (ref >60)
Glucose, Bld: 112 mg/dL — ABNORMAL HIGH (ref 70–99)
Potassium: 4.5 mmol/L (ref 3.5–5.1)
Sodium: 134 mmol/L — ABNORMAL LOW (ref 135–145)
Total Bilirubin: 0.6 mg/dL (ref 0.3–1.2)
Total Protein: 7.1 g/dL (ref 6.5–8.1)

## 2020-01-27 LAB — RESP PANEL BY RT-PCR (FLU A&B, COVID) ARPGX2
Influenza A by PCR: NEGATIVE
Influenza B by PCR: NEGATIVE
SARS Coronavirus 2 by RT PCR: POSITIVE — AB

## 2020-01-27 MED ORDER — SODIUM CHLORIDE 0.9 % IV BOLUS
1000.0000 mL | Freq: Once | INTRAVENOUS | Status: AC
  Administered 2020-01-27: 1000 mL via INTRAVENOUS

## 2020-01-27 NOTE — Telephone Encounter (Signed)
Called to discuss with patient about Covid symptoms and the use of the monoclonal antibody infusion for those with mild to moderate Covid symptoms and at a high risk of hospitalization.     Pt appears to qualify for this infusion due to co-morbid conditions and/or a member of an at-risk group in accordance with the FDA Emergency Use Authorization.    Risk factors include: Diabetes    Symptom onset: 11/19. Body aches and low back pain    Tested positive for COVID 19: 11/20 at [facility] ED   Discussed information regarding costs of monoclonal antibody treatment, given both CPT & REV codes, and encouraged patient to call their health insurance company to verify cost of treatment that pt will be financially responsible for.  Patient aware they will receive a call from APP for further information and to schedule appointment. All questions answered.

## 2020-01-27 NOTE — ED Provider Notes (Signed)
[facility] EMERGENCY DEPARTMENT Provider Note   CSN: [id number] Arrival date & time: 01/27/20  0857     History Chief Complaint  Patient presents with  . Back Pain    Tyler Simmons is a 49 y.o. male.  The history is provided by the patient. No language interpreter was used.  Back Pain Location:  Generalized Quality:  Aching Radiates to:  Does not radiate Pain severity:  Moderate Duration:  5 days Timing:  Constant Progression:  Worsening Chronicity:  New Relieved by:  Nothing Worsened by:  Nothing Ineffective treatments:  None tried Associated symptoms: dysuria and fever    Pt reports urine bubbles when he urinates.  Pt complains of back pain and a fever.  Pt has had a slight cough. Pt has had renal disease in the past     Past Medical History:  Diagnosis Date  . Diabetes mellitus without complication [facility])     Patient Active Problem List   Diagnosis Date Noted  . Rhabdomyolysis 10/20/2014  . ARF (acute renal failure) ([place]) 10/20/2014  . Dehydration 10/20/2014    Past Surgical History:  Procedure Laterality Date  . CIRCUMCISION    . KNEE SURGERY Left        No family history on file.  Social History   Tobacco Use  . Smoking status: Never Smoker  . Smokeless tobacco: Never Used  Substance Use Topics  . Alcohol use: Yes    Comment: Occ  . Drug use: Never    Home Medications Prior to Admission medications   Medication Sig Start Date End Date Taking? Authorizing Provider  cyclobenzaprine (FLEXERIL) 10 MG tablet Take 1 tablet (10 mg total) by mouth 2 (two) times daily as needed for muscle spasms. 01/06/18   Montine Circle, PA-C  ibuprofen (ADVIL) 800 MG tablet Take 1 tablet (800 mg total) by mouth 3 (three) times daily. 12/30/19   Avegno, Darrelyn Hillock, FNP  JANUVIA 100 MG tablet Take 100 mg by mouth daily. 08/27/14   [provider]  pioglitazone-metformin (ACTOPLUS MET) 15-850 MG per tablet Take 1 tablet by mouth 2 (two) times daily.  09/15/14   [provider]  VICTOZA 18 MG/3ML SOPN Inject 1.8 mLs into the skin daily. 09/15/14   [provider]  Vitamin D, Ergocalciferol, (DRISDOL) 50000 UNITS CAPS capsule Take 50,000 Units by mouth once a week. Takes on Sunday 09/15/14   [provider]    Allergies    Patient has no known allergies.  Review of Systems   Review of Systems  Constitutional: Positive for fever.  Genitourinary: Positive for dysuria.  Musculoskeletal: Positive for back pain.  All other systems reviewed and are negative.   Physical Exam Updated Vital Signs BP 133/73   Pulse 90   Temp (!) 101.3 F (38.5 C) (Oral)   Resp 16   Ht _0  (1.778 m)   Wt 99.8 kg   SpO2 93%   BMI 31.57 kg/m   Physical Exam Vitals and nursing note reviewed.  Constitutional:      Appearance: He is well-developed.  HENT:     Head: Normocephalic and atraumatic.  Eyes:     Conjunctiva/sclera: Conjunctivae normal.  Cardiovascular:     Rate and Rhythm: Normal rate and regular rhythm.     Heart sounds: No murmur heard.   Pulmonary:     Effort: Pulmonary effort is normal. No respiratory distress.     Breath sounds: Normal breath sounds.  Abdominal:     Palpations:  Abdomen is soft.     Tenderness: There is no abdominal tenderness.  Musculoskeletal:        General: Normal range of motion.     Cervical back: Neck supple.  Skin:    General: Skin is warm and dry.  Neurological:     General: No focal deficit present.     Mental Status: He is alert.  Psychiatric:        Mood and Affect: Mood normal.     ED Results / Procedures / Treatments   Labs (all labs ordered are listed, but only abnormal results are displayed) Labs Reviewed  RESP PANEL BY RT-PCR (FLU A&B, COVID) ARPGX2 - Abnormal; Notable for the following components:      Result Value   SARS Coronavirus 2 by RT PCR POSITIVE (*)    All other components within normal limits  URINALYSIS, ROUTINE W REFLEX MICROSCOPIC - Abnormal;  Notable for the following components:   Specific Gravity, Urine 1.031 (*)    Glucose, UA >=500 (*)    Hgb urine dipstick SMALL (*)    Ketones, ur 20 (*)    Protein, ur 100 (*)    All other components within normal limits  CBC WITH DIFFERENTIAL/PLATELET - Abnormal; Notable for the following components:   Platelets 114 (*)    All other components within normal limits  COMPREHENSIVE METABOLIC PANEL - Abnormal; Notable for the following components:   Sodium 134 (*)    Glucose, Bld 112 (*)    Calcium 8.5 (*)    AST 50 (*)    All other components within normal limits    EKG None  Radiology No results found.  Procedures Procedures (including critical care time)  Medications Ordered in ED Medications  sodium chloride 0.9 % bolus 1,000 mL (0 mLs Intravenous Stopped 01/27/20 1152)    ED Course  I have reviewed the triage vital signs and the nursing notes.  Pertinent labs & imaging results that were available during my care of the patient were reviewed by me and considered in my medical decision making (see chart for details).    MDM Rules/Calculators/A&P                        MDM: covid is positive.  MAB referral made.  Pt advised quarantine and tylenol for fever.  Final Clinical Impression(s) / ED Diagnoses Final diagnoses:  [id number]    Rx / DC Orders ED Discharge Orders    None    An After Visit Summary was printed and given to the patient.    Sidney Ace 01/27/20 1230    Noemi Chapel, MD 01/28/20 [phone]

## 2020-01-27 NOTE — ED Triage Notes (Signed)
Pt reports lower back pain for the past 5 days.  Also says his urine has been "bubbly" for the past 5 days and c/o cramping in legs.

## 2020-01-27 NOTE — Progress Notes (Signed)
I connected by phone with Tyler Simmons on 01/27/2020 at 5:38 PM to discuss the potential use of a new treatment for mild to moderate COVID-19 viral infection in non-hospitalized patients.  This patient is a 49 y.o. male that meets the FDA criteria for Emergency Use Authorization of COVID monoclonal antibody casirivimab/imdevimab, bamlanivimab/eteseviamb, or sotrovimab.  Has a (+) direct SARS-CoV-2 viral test result  Has mild or moderate COVID-19   Is NOT hospitalized due to COVID-19  Is within 10 days of symptom onset  Has at least one of the high risk factor(s) for progression to severe COVID-19 and/or hospitalization as defined in EUA.  Specific high risk criteria : BMI > 25   Symptoms of body aches began 01/24/20.    I have spoken and communicated the following to the patient or parent/caregiver regarding COVID monoclonal antibody treatment:  1. FDA has authorized the emergency use for the treatment of mild to moderate COVID-19 in adults and pediatric patients with positive results of direct SARS-CoV-2 viral testing who are 29 years of age and older weighing at least 40 kg, and who are at high risk for progressing to severe COVID-19 and/or hospitalization.  2. The significant known and potential risks and benefits of COVID monoclonal antibody, and the extent to which such potential risks and benefits are unknown.  3. Information on available alternative treatments and the risks and benefits of those alternatives, including clinical trials.  4. Patients treated with COVID monoclonal antibody should continue to self-isolate and use infection control measures (e.g., wear mask, isolate, social distance, avoid sharing personal items, clean and disinfect "high touch" surfaces, and frequent handwashing) according to CDC guidelines.   5. The patient or parent/caregiver has the option to accept or refuse COVID monoclonal antibody treatment.  After reviewing this information with the  patient, the patient has agreed to receive one of the available covid 19 monoclonal antibodies and will be provided an appropriate fact sheet prior to infusion. Morton Stall, NP 01/27/2020 5:38 PM

## 2020-01-27 NOTE — Discharge Instructions (Addendum)
Return if any problems.

## 2020-01-29 ENCOUNTER — Ambulatory Visit (HOSPITAL_COMMUNITY)
Admission: RE | Admit: 2020-01-29 | Discharge: 2020-01-29 | Disposition: A | Discharge status: Home / Self Care | Payer: BC Managed Care – PPO | Source: Ambulatory Visit | Attending: Pulmonary Disease | Admitting: Pulmonary Disease

## 2020-01-29 DIAGNOSIS — U071 COVID-19: Secondary | ICD-10-CM | POA: Diagnosis not present

## 2020-01-29 MED ORDER — SODIUM CHLORIDE 0.9 % IV SOLN: INTRAVENOUS | Status: DC | PRN

## 2020-01-29 MED ORDER — EPINEPHRINE 0.3 MG/0.3ML IJ SOAJ: 0.3000 mg | Freq: Once | INTRAMUSCULAR | Status: DC | PRN

## 2020-01-29 MED ORDER — DIPHENHYDRAMINE HCL 50 MG/ML IJ SOLN: 50.0000 mg | Freq: Once | INTRAMUSCULAR | Status: DC | PRN

## 2020-01-29 MED ORDER — FAMOTIDINE IN NACL 20-0.9 MG/50ML-% IV SOLN: 20.0000 mg | Freq: Once | INTRAVENOUS | Status: DC | PRN

## 2020-01-29 MED ORDER — ACETAMINOPHEN 325 MG PO TABS
650.0000 mg | ORAL_TABLET | Freq: Four times a day (QID) | ORAL | Status: DC | PRN
  Administered 2020-01-29: 650 mg via ORAL
  Filled 2020-01-29: qty 2

## 2020-01-29 MED ORDER — SOTROVIMAB 500 MG/8ML IV SOLN
500.0000 mg | Freq: Once | INTRAVENOUS | Status: AC
  Administered 2020-01-29: 500 mg via INTRAVENOUS

## 2020-01-29 MED ORDER — ALBUTEROL SULFATE HFA 108 (90 BASE) MCG/ACT IN AERS: 2.0000 | INHALATION_SPRAY | Freq: Once | RESPIRATORY_TRACT | Status: DC | PRN

## 2020-01-29 MED ORDER — METHYLPREDNISOLONE SODIUM SUCC 125 MG IJ SOLR: 125.0000 mg | Freq: Once | INTRAMUSCULAR | Status: DC | PRN

## 2020-01-29 NOTE — Discharge Instructions (Signed)

## 2020-01-29 NOTE — Progress Notes (Signed)
1145  RN to room, pt c/o tingling in left hand. Neuro assessment performed, WNL. Informed pt that because IV is in the back of left hand, it could be the medication that was infused. RN informed Lillard Anes, NP via secure chat message. No new orders received. WCTM patient.   1215 Pt discharged home, tingling has gotten better. APP hotline provided to patient if symptoms worsen. Pt understands without assistance.

## 2020-01-29 NOTE — Progress Notes (Signed)
Patient reviewed Fact Sheet for Patients, Parents, and Caregivers for Emergency Use Authorization (EUA) of Sotrovimab for the Treatment of Coronavirus. Patient also reviewed and is agreeable to the estimated cost of treatment. Patient is agreeable to proceed.    Diagnosis: COVID-19  Physician: Dr. Shan Levans  Procedure: Covid Infusion Clinic Med: Sotrovimab infusion - Provided patient with sotrovimab fact sheet for patients, parents, and caregivers prior to infusion.   Complications: Pt c/o tingling in his left hand. Pts periferal IV is in his left hand. APP notified. Pt states itching in hand has stopped. Advised pt to call APP number provided if he has any questions or concerns. Pt states understanding.   Discharge: Discharged home  If after the infusion you have any questions or concerns please call the Advanced Practice Provider at [phone]   Tyler Simmons 01/29/2020
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
## 2021-09-19 DIAGNOSIS — E1165 Type 2 diabetes mellitus with hyperglycemia: Secondary | ICD-10-CM | POA: Diagnosis not present

## 2021-09-19 DIAGNOSIS — I10 Essential (primary) hypertension: Secondary | ICD-10-CM | POA: Diagnosis not present

## 2021-09-19 DIAGNOSIS — E298 Other testicular dysfunction: Secondary | ICD-10-CM | POA: Diagnosis not present

## 2021-09-19 DIAGNOSIS — E785 Hyperlipidemia, unspecified: Secondary | ICD-10-CM | POA: Diagnosis not present

## 2021-10-03 DIAGNOSIS — Z6831 Body mass index (BMI) 31.0-31.9, adult: Secondary | ICD-10-CM | POA: Diagnosis not present

## 2021-10-03 DIAGNOSIS — E1165 Type 2 diabetes mellitus with hyperglycemia: Secondary | ICD-10-CM | POA: Diagnosis not present

## 2021-10-03 DIAGNOSIS — E559 Vitamin D deficiency, unspecified: Secondary | ICD-10-CM | POA: Diagnosis not present

## 2021-10-03 DIAGNOSIS — I10 Essential (primary) hypertension: Secondary | ICD-10-CM | POA: Diagnosis not present

## 2021-10-03 DIAGNOSIS — E669 Obesity, unspecified: Secondary | ICD-10-CM | POA: Diagnosis not present

## 2022-02-02 IMAGING — DX DG SHOULDER 2+V*L*
3 series · 3 of 3 positions shown · non-contrast
Comparison: None.

CLINICAL DATA: Patient states that he was moving heavy cinder
blocks around yesterday and has pulled something in his left
shoulder, limited ROM and pain. No HX left shoulder pain/ concern
for dislocation

EXAM:
LEFT SHOULDER - 2+ VIEW

[shoulder internal rotation ap]
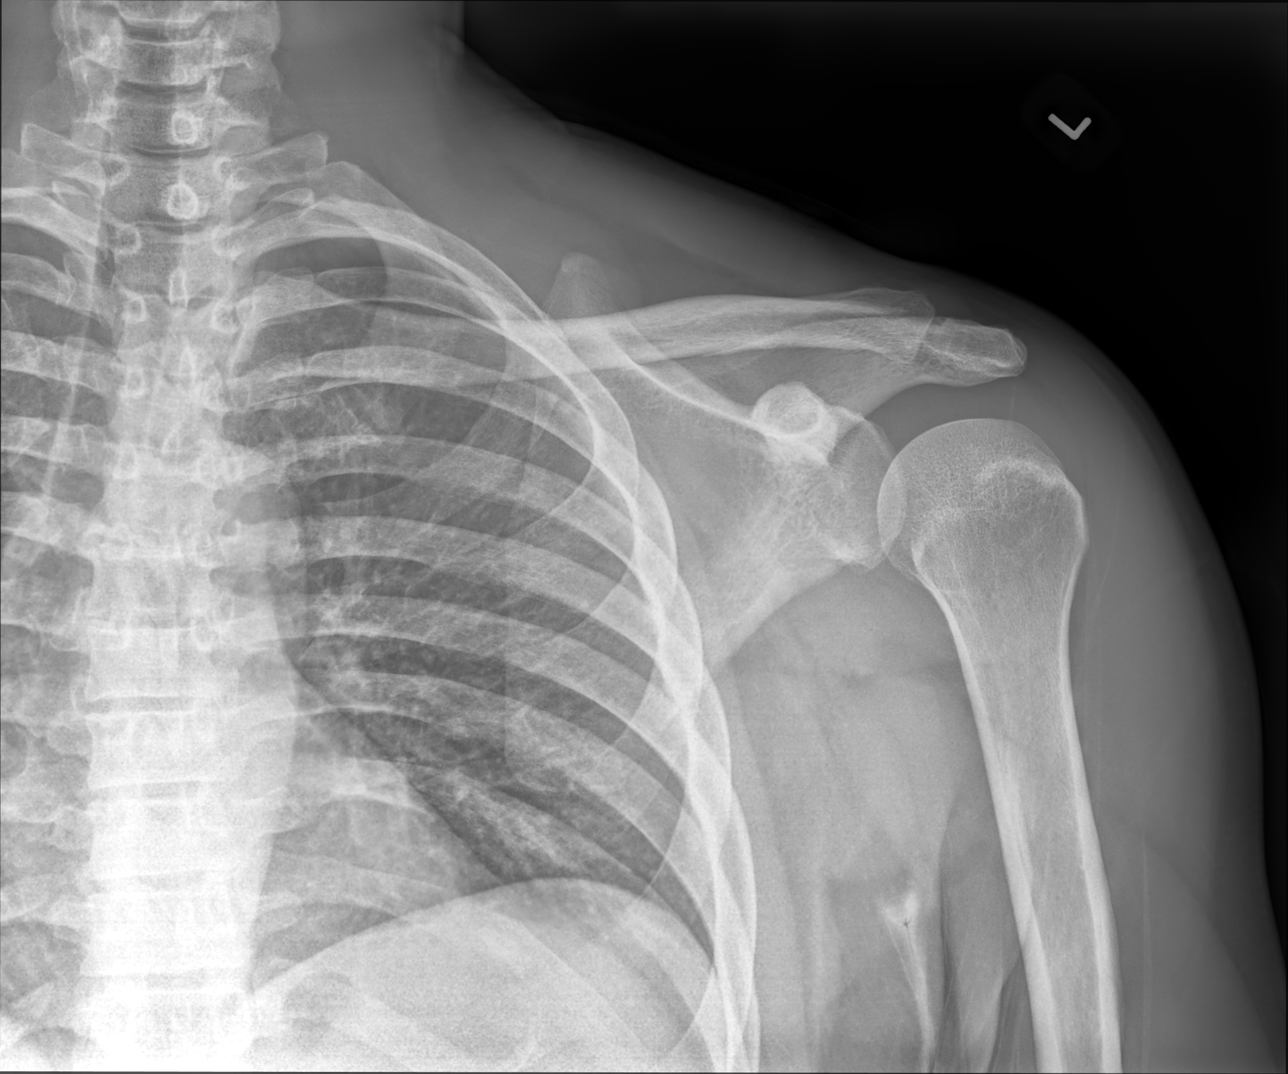

[shoulder external rotation ap]
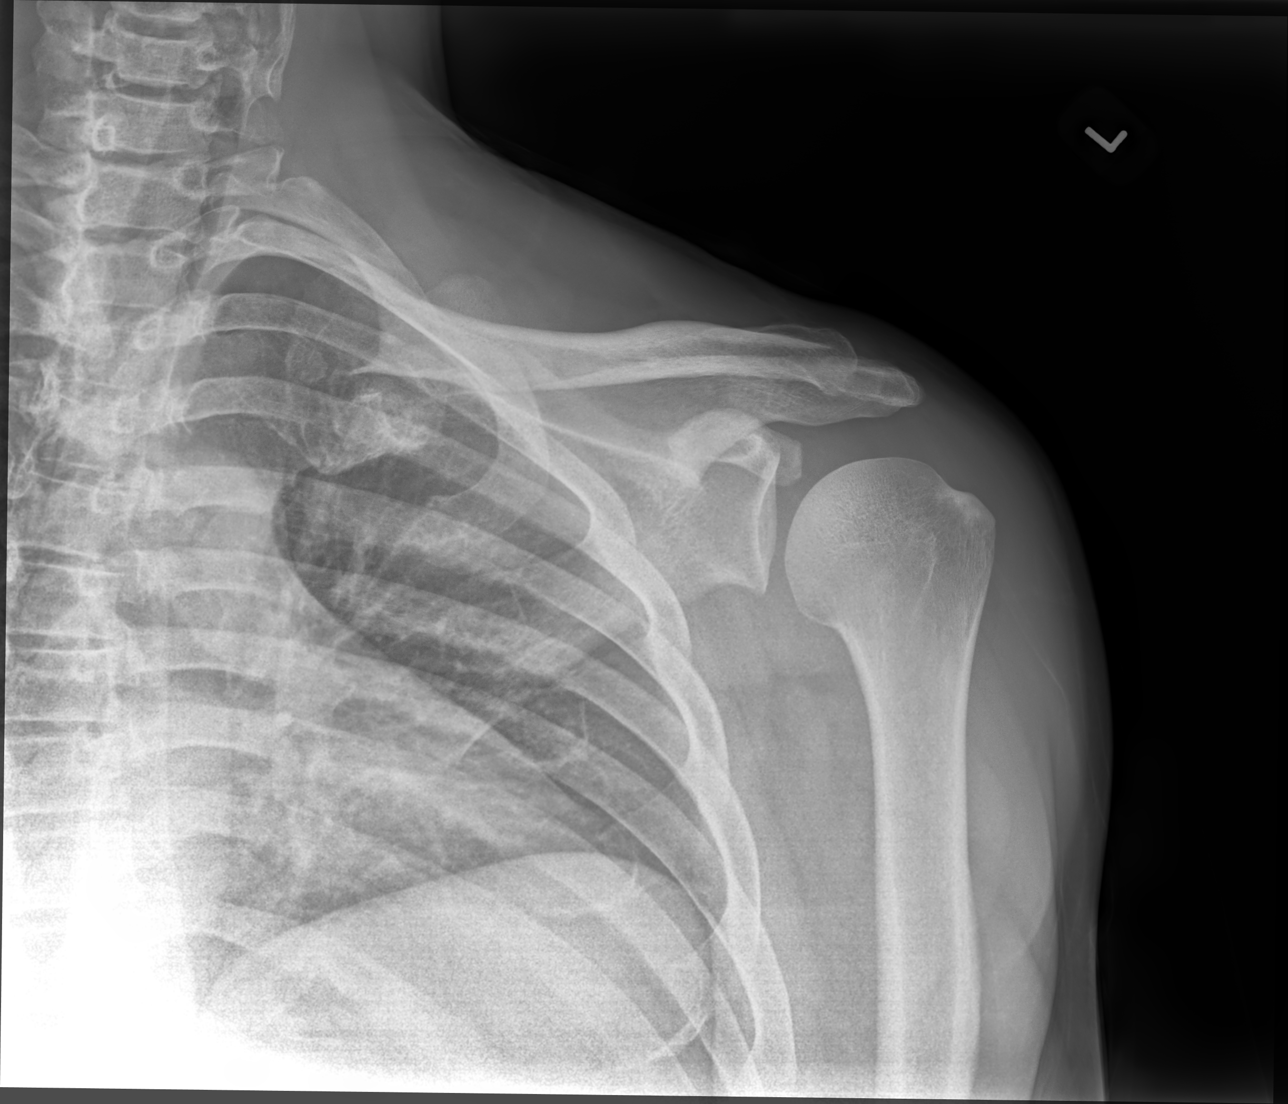

[shoulder (y view) lat]
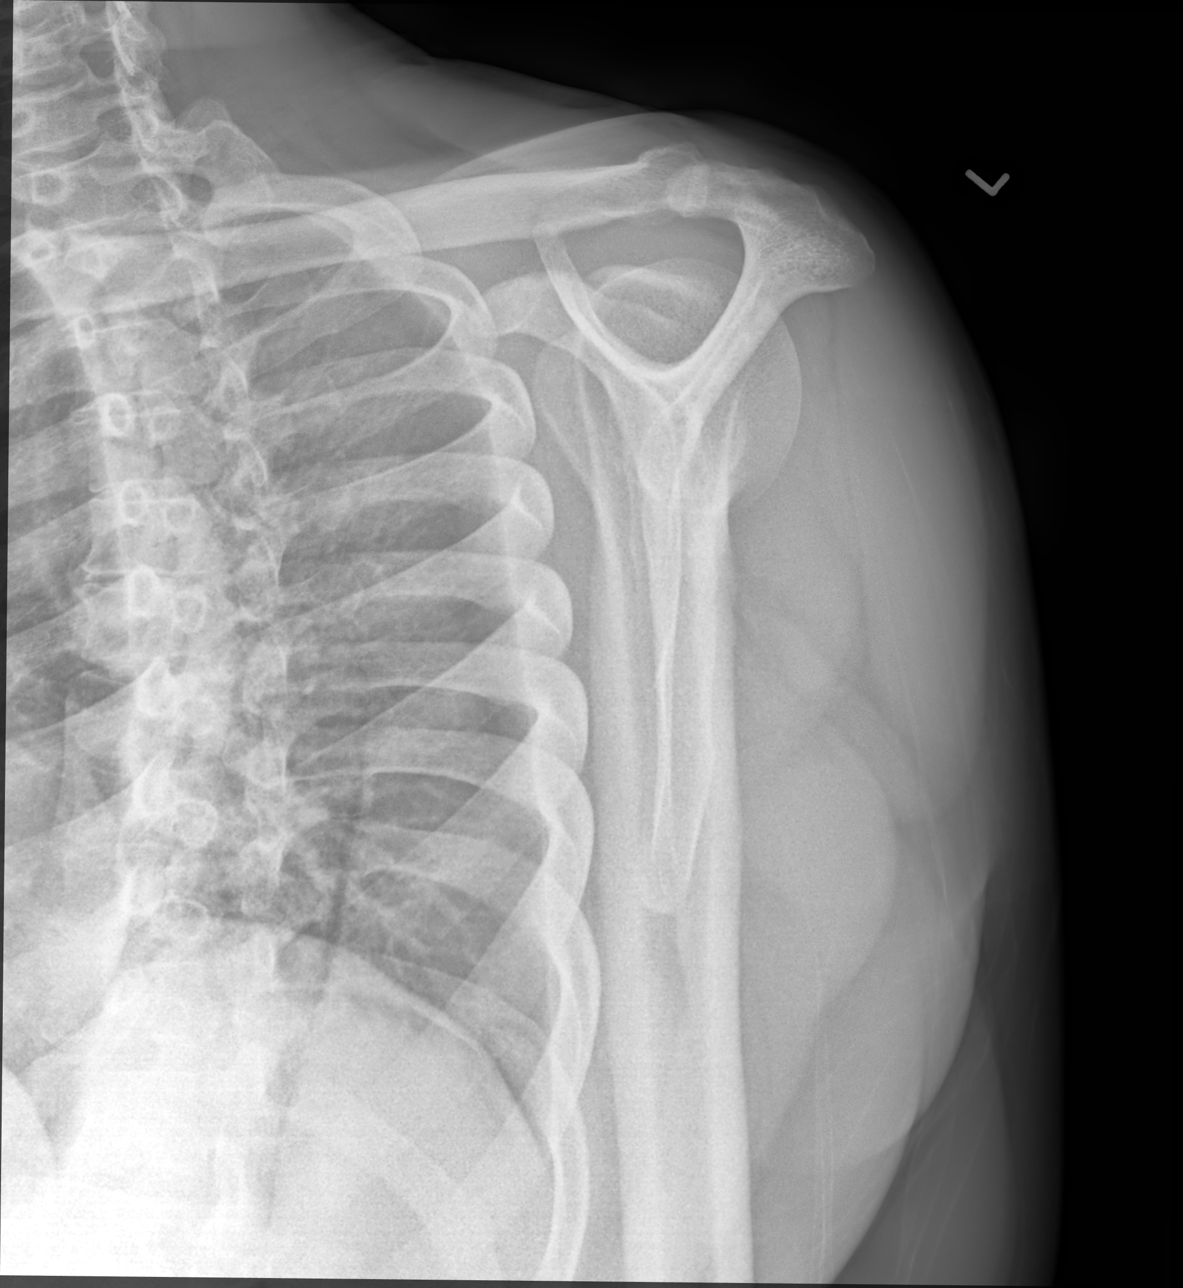

[3 of 3 positions shown; findings below may reference images not displayed]

FINDINGS: Glenohumeral joint is intact. No evidence of scapular fracture or
humeral fracture. The acromioclavicular joint is intact.
IMPRESSION: No fracture or dislocation.

## 2022-04-03 DIAGNOSIS — E1165 Type 2 diabetes mellitus with hyperglycemia: Secondary | ICD-10-CM | POA: Diagnosis not present

## 2022-04-03 DIAGNOSIS — E785 Hyperlipidemia, unspecified: Secondary | ICD-10-CM | POA: Diagnosis not present

## 2022-04-03 DIAGNOSIS — E559 Vitamin D deficiency, unspecified: Secondary | ICD-10-CM | POA: Diagnosis not present

## 2022-04-03 DIAGNOSIS — I10 Essential (primary) hypertension: Secondary | ICD-10-CM | POA: Diagnosis not present

## 2022-04-10 DIAGNOSIS — I10 Essential (primary) hypertension: Secondary | ICD-10-CM | POA: Diagnosis not present

## 2022-04-10 DIAGNOSIS — E669 Obesity, unspecified: Secondary | ICD-10-CM | POA: Diagnosis not present

## 2022-04-10 DIAGNOSIS — E785 Hyperlipidemia, unspecified: Secondary | ICD-10-CM | POA: Diagnosis not present

## 2022-04-10 DIAGNOSIS — Z6831 Body mass index (BMI) 31.0-31.9, adult: Secondary | ICD-10-CM | POA: Diagnosis not present

## 2022-04-10 DIAGNOSIS — E559 Vitamin D deficiency, unspecified: Secondary | ICD-10-CM | POA: Diagnosis not present

## 2022-04-10 DIAGNOSIS — E298 Other testicular dysfunction: Secondary | ICD-10-CM | POA: Diagnosis not present

## 2022-04-10 DIAGNOSIS — E1165 Type 2 diabetes mellitus with hyperglycemia: Secondary | ICD-10-CM | POA: Diagnosis not present

## 2022-10-09 DIAGNOSIS — E1165 Type 2 diabetes mellitus with hyperglycemia: Secondary | ICD-10-CM | POA: Diagnosis not present

## 2022-10-09 DIAGNOSIS — E785 Hyperlipidemia, unspecified: Secondary | ICD-10-CM | POA: Diagnosis not present

## 2022-10-09 DIAGNOSIS — I10 Essential (primary) hypertension: Secondary | ICD-10-CM | POA: Diagnosis not present

## 2022-10-16 DIAGNOSIS — E1165 Type 2 diabetes mellitus with hyperglycemia: Secondary | ICD-10-CM | POA: Diagnosis not present

## 2022-10-16 DIAGNOSIS — E559 Vitamin D deficiency, unspecified: Secondary | ICD-10-CM | POA: Diagnosis not present

## 2022-10-16 DIAGNOSIS — E669 Obesity, unspecified: Secondary | ICD-10-CM | POA: Diagnosis not present

## 2022-10-16 DIAGNOSIS — E298 Other testicular dysfunction: Secondary | ICD-10-CM | POA: Diagnosis not present

## 2022-10-16 DIAGNOSIS — E785 Hyperlipidemia, unspecified: Secondary | ICD-10-CM | POA: Diagnosis not present

## 2022-10-16 DIAGNOSIS — I10 Essential (primary) hypertension: Secondary | ICD-10-CM | POA: Diagnosis not present

## 2022-10-16 DIAGNOSIS — Z6831 Body mass index (BMI) 31.0-31.9, adult: Secondary | ICD-10-CM | POA: Diagnosis not present

## 2023-03-24 DIAGNOSIS — Z6830 Body mass index (BMI) 30.0-30.9, adult: Secondary | ICD-10-CM | POA: Diagnosis not present

## 2023-03-24 DIAGNOSIS — Z23 Encounter for immunization: Secondary | ICD-10-CM | POA: Diagnosis not present

## 2023-03-24 DIAGNOSIS — E559 Vitamin D deficiency, unspecified: Secondary | ICD-10-CM | POA: Diagnosis not present

## 2023-03-24 DIAGNOSIS — B356 Tinea cruris: Secondary | ICD-10-CM | POA: Diagnosis not present

## 2023-03-24 DIAGNOSIS — Z9229 Personal history of other drug therapy: Secondary | ICD-10-CM | POA: Diagnosis not present

## 2023-03-24 DIAGNOSIS — Z0001 Encounter for general adult medical examination with abnormal findings: Secondary | ICD-10-CM | POA: Diagnosis not present

## 2023-03-24 DIAGNOSIS — E1165 Type 2 diabetes mellitus with hyperglycemia: Secondary | ICD-10-CM | POA: Diagnosis not present

## 2023-03-24 DIAGNOSIS — E6609 Other obesity due to excess calories: Secondary | ICD-10-CM | POA: Diagnosis not present

## 2023-04-12 DIAGNOSIS — I10 Essential (primary) hypertension: Secondary | ICD-10-CM | POA: Diagnosis not present

## 2023-04-12 DIAGNOSIS — R509 Fever, unspecified: Secondary | ICD-10-CM | POA: Diagnosis not present

## 2023-04-12 DIAGNOSIS — J019 Acute sinusitis, unspecified: Secondary | ICD-10-CM | POA: Diagnosis not present

## 2023-04-12 DIAGNOSIS — H6123 Impacted cerumen, bilateral: Secondary | ICD-10-CM | POA: Diagnosis not present

## 2023-12-10 DIAGNOSIS — E559 Vitamin D deficiency, unspecified: Secondary | ICD-10-CM | POA: Diagnosis not present

## 2023-12-10 DIAGNOSIS — B351 Tinea unguium: Secondary | ICD-10-CM | POA: Diagnosis not present

## 2023-12-10 DIAGNOSIS — E119 Type 2 diabetes mellitus without complications: Secondary | ICD-10-CM | POA: Diagnosis not present

## 2024-02-11 DIAGNOSIS — E1165 Type 2 diabetes mellitus with hyperglycemia: Secondary | ICD-10-CM | POA: Diagnosis not present
